# Patient Record
Sex: Male | Born: 2005 | Race: White | Hispanic: Yes | Marital: Single | Smoking: Never smoker
Health system: Southern US, Community
[De-identification: ages and names within clinical notes are randomized; demographics above are authoritative.]

## PROBLEM LIST (undated history)

## (undated) DIAGNOSIS — D444 Neoplasm of uncertain behavior of craniopharyngeal duct: Secondary | ICD-10-CM

## (undated) DIAGNOSIS — E230 Hypopituitarism: Secondary | ICD-10-CM

## (undated) DIAGNOSIS — H539 Unspecified visual disturbance: Secondary | ICD-10-CM

## (undated) DIAGNOSIS — R51 Headache: Secondary | ICD-10-CM

## (undated) DIAGNOSIS — T8859XA Other complications of anesthesia, initial encounter: Secondary | ICD-10-CM

## (undated) DIAGNOSIS — E2740 Unspecified adrenocortical insufficiency: Secondary | ICD-10-CM

## (undated) DIAGNOSIS — F909 Attention-deficit hyperactivity disorder, unspecified type: Secondary | ICD-10-CM

## (undated) HISTORY — PX: BRAIN SURGERY: SHX531

## (undated) HISTORY — PX: GASTROSTOMY TUBE CHANGE: SHX312

## (undated) HISTORY — PX: VENTRICULOPERITONEAL SHUNT: SHX204

## (undated) HISTORY — PX: CIRCUMCISION: SUR203

---
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
## 2024-01-06 ENCOUNTER — Ambulatory Visit (INDEPENDENT_AMBULATORY_CARE_PROVIDER_SITE_OTHER): Payer: Self-pay | Admitting: Pediatrics

## 2024-01-06 ENCOUNTER — Other Ambulatory Visit (INDEPENDENT_AMBULATORY_CARE_PROVIDER_SITE_OTHER): Payer: Self-pay

## 2024-01-06 ENCOUNTER — Encounter (INDEPENDENT_AMBULATORY_CARE_PROVIDER_SITE_OTHER): Payer: Self-pay | Admitting: Pediatrics

## 2024-01-06 VITALS — BP 98/62 | HR 66 | Wt 167.0 lb

## 2024-01-06 DIAGNOSIS — G8929 Other chronic pain: Secondary | ICD-10-CM

## 2024-01-06 DIAGNOSIS — R569 Unspecified convulsions: Secondary | ICD-10-CM

## 2024-01-06 DIAGNOSIS — E871 Hypo-osmolality and hyponatremia: Secondary | ICD-10-CM

## 2024-01-06 DIAGNOSIS — R269 Unspecified abnormalities of gait and mobility: Secondary | ICD-10-CM

## 2024-01-06 DIAGNOSIS — M25562 Pain in left knee: Secondary | ICD-10-CM

## 2024-01-06 DIAGNOSIS — D444 Neoplasm of uncertain behavior of craniopharyngeal duct: Secondary | ICD-10-CM | POA: Diagnosis not present

## 2024-01-06 DIAGNOSIS — F902 Attention-deficit hyperactivity disorder, combined type: Secondary | ICD-10-CM | POA: Diagnosis not present

## 2024-01-06 DIAGNOSIS — E232 Diabetes insipidus: Secondary | ICD-10-CM

## 2024-01-06 LAB — RENAL FUNCTION PANEL
Albumin: 4.3 g/dL (ref 3.6–5.1)
BUN: 18 mg/dL (ref 7–20)
CO2: 24 mmol/L (ref 20–32)
Calcium: 9.9 mg/dL (ref 8.9–10.4)
Chloride: 106 mmol/L (ref 98–110)
Creat: 0.91 mg/dL (ref 0.60–1.24)
Glucose, Bld: 104 mg/dL (ref 65–139)
Phosphorus: 5.1 mg/dL — ABNORMAL HIGH (ref 2.7–5.0)
Potassium: 4 mmol/L (ref 3.8–5.1)
Sodium: 144 mmol/L (ref 135–146)

## 2024-01-06 NOTE — Patient Instructions (Signed)
 Symptom management: Start Vimpat  Medication      Vimpat Dose 100 mg tablets    AM   PM   Week 1 1 tab (100 mg total) 1 tab (100 mg total)   Week 2 1.5 tab (150 mg total) 1.5 tab (150 mg total)   Week 3 and continue 2 tab (200 mg total) 2 tab (200mg  total)    Stop the medication and call me if Jaymien  develops side effects or has worsening seizures.  Please call if you have questions.   Stop Keppra  Medication      Keppra  Dose 500 mg tablets    AM   PM   Week 1 1 tab (500 mg total) 1 tab (500 mg total)   Week 2 0.5 tab (250 mg total) 0.5 tab (250mg  total)   Week 3 and continue off off    Stop the medication and call me if Earlin  develops side effects or has worsening seizures.  Please call if you have questions.   Referred to [facility] for behaviors when he gets upset We will not start Qelbree while starting Vimpat. We can consider in the future for impulsivity to help with sedation.  Care management: We will put in an order for chest PT for his nurses Referred for in-home PT. They can help identify the best type of knee brace Talk to his [facility] case manager about a day program or other options for social interaction Equipment needs: We will put in an order for a chest vest

## 2024-01-09 ENCOUNTER — Encounter (INDEPENDENT_AMBULATORY_CARE_PROVIDER_SITE_OTHER): Payer: Self-pay | Admitting: Pediatrics

## 2024-01-10 ENCOUNTER — Ambulatory Visit (INDEPENDENT_AMBULATORY_CARE_PROVIDER_SITE_OTHER): Payer: Self-pay | Admitting: Pediatrics

## 2024-01-10 MED ORDER — LACOSAMIDE 100 MG PO TABS
200.0000 mg | ORAL_TABLET | Freq: Two times a day (BID) | ORAL | 3 refills | Status: DC
Start: 2024-01-10 — End: 2024-04-10

## 2024-01-10 NOTE — Progress Notes (Signed)
Normal sodium.

## 2024-01-20 ENCOUNTER — Other Ambulatory Visit (INDEPENDENT_AMBULATORY_CARE_PROVIDER_SITE_OTHER): Payer: Self-pay | Admitting: Family

## 2024-01-20 ENCOUNTER — Other Ambulatory Visit (HOSPITAL_COMMUNITY): Payer: Self-pay

## 2024-01-20 MED ORDER — LEVETIRACETAM 500 MG PO TABS
500.0000 mg | ORAL_TABLET | Freq: Two times a day (BID) | ORAL | 0 refills | Status: DC
  Filled 2024-01-20: qty 30, 15d supply, fill #0

## 2024-01-24 ENCOUNTER — Encounter (INDEPENDENT_AMBULATORY_CARE_PROVIDER_SITE_OTHER): Payer: Self-pay

## 2024-01-24 ENCOUNTER — Encounter (INDEPENDENT_AMBULATORY_CARE_PROVIDER_SITE_OTHER): Payer: Self-pay | Admitting: Pediatrics

## 2024-01-27 ENCOUNTER — Ambulatory Visit (INDEPENDENT_AMBULATORY_CARE_PROVIDER_SITE_OTHER): Admitting: *Deleted

## 2024-01-27 DIAGNOSIS — F909 Attention-deficit hyperactivity disorder, unspecified type: Secondary | ICD-10-CM | POA: Diagnosis not present

## 2024-01-27 DIAGNOSIS — D444 Neoplasm of uncertain behavior of craniopharyngeal duct: Secondary | ICD-10-CM

## 2024-01-27 DIAGNOSIS — R269 Unspecified abnormalities of gait and mobility: Secondary | ICD-10-CM

## 2024-01-27 MED ORDER — ALBUTEROL SULFATE (2.5 MG/3ML) 0.083% IN NEBU: 2.5000 mg | INHALATION_SOLUTION | Freq: Four times a day (QID) | RESPIRATORY_TRACT | 0 refills | Status: AC | PRN
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
## 2024-02-11 ENCOUNTER — Ambulatory Visit (INDEPENDENT_AMBULATORY_CARE_PROVIDER_SITE_OTHER): Payer: Self-pay | Admitting: *Deleted

## 2024-02-11 DIAGNOSIS — F909 Attention-deficit hyperactivity disorder, unspecified type: Secondary | ICD-10-CM | POA: Diagnosis not present
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
## 2024-02-14 ENCOUNTER — Other Ambulatory Visit (INDEPENDENT_AMBULATORY_CARE_PROVIDER_SITE_OTHER): Payer: Self-pay | Admitting: Pediatrics

## 2024-02-14 DIAGNOSIS — E230 Hypopituitarism: Secondary | ICD-10-CM

## 2024-02-14 DIAGNOSIS — E2749 Other adrenocortical insufficiency: Secondary | ICD-10-CM

## 2024-02-19 ENCOUNTER — Other Ambulatory Visit (INDEPENDENT_AMBULATORY_CARE_PROVIDER_SITE_OTHER): Payer: Self-pay | Admitting: Family

## 2024-02-19 DIAGNOSIS — F909 Attention-deficit hyperactivity disorder, unspecified type: Secondary | ICD-10-CM

## 2024-02-25 ENCOUNTER — Other Ambulatory Visit (INDEPENDENT_AMBULATORY_CARE_PROVIDER_SITE_OTHER): Payer: Self-pay | Admitting: Family

## 2024-02-25 DIAGNOSIS — F909 Attention-deficit hyperactivity disorder, unspecified type: Secondary | ICD-10-CM

## 2024-03-02 ENCOUNTER — Ambulatory Visit (INDEPENDENT_AMBULATORY_CARE_PROVIDER_SITE_OTHER): Payer: Self-pay | Admitting: Pediatrics

## 2024-03-02 ENCOUNTER — Encounter (INDEPENDENT_AMBULATORY_CARE_PROVIDER_SITE_OTHER): Payer: Self-pay | Admitting: Pediatrics

## 2024-03-02 VITALS — BP 112/72 | HR 82 | Ht 65.55 in | Wt 166.2 lb

## 2024-03-02 DIAGNOSIS — E038 Other specified hypothyroidism: Secondary | ICD-10-CM

## 2024-03-02 DIAGNOSIS — E232 Diabetes insipidus: Secondary | ICD-10-CM

## 2024-03-02 DIAGNOSIS — E2749 Other adrenocortical insufficiency: Secondary | ICD-10-CM | POA: Diagnosis not present

## 2024-03-02 DIAGNOSIS — B351 Tinea unguium: Secondary | ICD-10-CM

## 2024-03-02 DIAGNOSIS — E230 Hypopituitarism: Secondary | ICD-10-CM

## 2024-03-02 DIAGNOSIS — Z79899 Other long term (current) drug therapy: Secondary | ICD-10-CM

## 2024-03-02 DIAGNOSIS — Z7187 Encounter for pediatric-to-adult transition counseling: Secondary | ICD-10-CM

## 2024-03-02 DIAGNOSIS — M8580 Other specified disorders of bone density and structure, unspecified site: Secondary | ICD-10-CM

## 2024-03-02 NOTE — Assessment & Plan Note (Signed)
-  Clinically euthyroid -Continue levothyroxine daily -Obtain TFTs with next blood draw, rec labs Q6 months

## 2024-03-02 NOTE — Assessment & Plan Note (Signed)
-  Moods are stable -current dose of Testosterone  enanthate 200mg /mL: 6 units = twice a week.

## 2024-03-02 NOTE — Progress Notes (Signed)
 Pediatric Endocrinology Consultation Follow-up Visit Ronald Brown Nov 20, 2005 [id number] Arlys Rogue, MD   HPI: Ronald Brown  is a 18 y.o. male presenting for follow-up of Panhypopituitarism.  he is accompanied to this visit by his father. Interpreter present throughout the visit: No.  [place] was last seen at [facility] on 09/03/2023.  Since last visit, Father has measured at home and he is at least 5'5.  Thyroid : levothyroxine  levo 88mcg daily with no missed doses. There has been no heat/cold intolerance, constipation/diarrhea, rapid heart rate, tremor, mood changes, poor energy, fatigue, nor hair loss. Chronic dry skin and constipation still treated with M.O.M. Growth: Norditropin  1.4mg  SQ at bedtime (0.13mg /kg/week) Adrenal Insufficiency: Body surface area is 1.87 meters squared.  -Hydrocortisone /Alkindi  sprinkles: 10mg  in AM, 5 mg in afternoon, and 5 mg in evening = 20mg /day  = 10.69 mg/m2/day  -Need for stress dosing: Yes for URI over May 2025 AVP Deficiency: Yes. dDAVP  1 tab in AM and 2 tabs in PM after 2 large voids. Father adjusts dose as needed. Yesterday didn't need to to later in the day.  Hypogonadism: Yes. Testosterone  enanthate 200mg /mL: 6 units = twice a week. Father adjusting doses based on aggression.  ROS: Greater than 10 systems reviewed with pertinent positives listed in HPI, otherwise neg. 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  Meds: Current Outpatient Medications  Medication Instructions   albuterol  (PROVENTIL ) 2.5 mg, Nebulization, Every 6 hours PRN   albuterol   (VENTOLIN  HFA) 108 (90 Base) MCG/ACT inhaler 1 puff, Every 6 hours PRN   desmopressin  (DDAVP ) 0.1 MG tablet TAKE 2 TABLETS (0.2 MG TOTAL) BY MOUTH TWICE A DAY   diazePAM , 15 MG Dose, (VALTOCO  15 MG DOSE) 2 x 7.5 MG/0.1ML LQPK Place 15 mg into the nose as needed (Inhale one spray ' 7.5 mg' in each nostril as needed for break through seizures).   folic acid  (FOLVITE ) 1 mg, Oral, Daily   guanFACINE  (INTUNIV ) 1 mg, Oral, Daily   hydrocortisone  (CORTEF ) 10 MG tablet TAKE 1 TAB BY MOUTH IN MORNING,TAKE 1/2 TAB BY MOUTH AT LUNCH&DINNER. Stress dose: triple or double the dose.   hydrocortisone  sodium succinate  (SOLU-CORTEF ) 100 MG injection Inject 100 mg into the muscle for 1 dose for acute adrenal insufficiency, then call EMS/go to the emergency room.   Insulin  Pen Needle (B-D UF III MINI PEN NEEDLES) 31G X 5 MM MISC Use as directed with growth hormone.   Insulin  Syringe-Needle U-100 31G X 15/64 0.3 ML MISC Use daily.   Lacosamide  200 mg, Oral, 2 times daily   levETIRAcetam  (KEPPRA ) 500 mg, Oral, 2 times daily   levothyroxine  (SYNTHROID ) 88 MCG tablet TAKE 1 TABLET BY MOUTH EVERY DAY BEFORE BREAKFAST   magnesium  aspartate (MAGINEX) 615 mg, Daily   magnesium  hydroxide (MILK OF MAGNESIA) 400 MG/5ML suspension 20 mLs, 2 times daily   melatonin 5 mg, Daily at bedtime   Norditropin  FlexPro 1.6 mg, Subcutaneous, Daily at bedtime   potassium chloride  (KLOR-CON ) 10 MEQ tablet TAKE 1 TABLET ( ) BY MOUTH DAILY**ON WEDNESDAY AND SUNDAY TAKE TWICE A DAY**   QUEtiapine  (SEROQUEL ) 50 MG tablet TAKE 1 TABLET BY MOUTH EVERYDAY AT BEDTIME   sodium chloride , PF, 0.9 % injection For use in adrenal crisis to be mixed  with hydrocortisone  as directed.   Syringe/Needle, Disp, (SYRINGE 3CC/25GX1) 25G X 1 3 ML MISC Use with hydrocortisone  for adrenal crisis.   testosterone  enanthate (DELATESTRYL ) 200 MG/ML injection Inject 6 mg into the skin once a week. 6 mg = 0.03 mL = 3 units on insulin  syringe   vitamin B-12  (CYANOCOBALAMIN ) 100 mcg, Oral, Daily   Vitamin D  5,000 Units, BH-each morning    Allergies: No Known Allergies  Surgical History: Past Surgical History:  Procedure Laterality Date   BRAIN SURGERY     June 2014   CIRCUMCISION     GASTROSTOMY TUBE CHANGE     removed   RADIOLOGY WITH ANESTHESIA N/A 08/13/2020   Procedure: MRI WITH ANESTHESIA OF BRAIN WITH AND WITHOUT CONTRAST;  Surgeon: Radiologist, Medication, MD;  Location: [facility] OR;  Service: Radiology;  Laterality: N/A;   RADIOLOGY WITH ANESTHESIA N/A 07/21/2022   Procedure: MRI BRAIN WITH AND WITHOUT CONTRAST WITH ANESTHESIA;  Surgeon: Radiologist, Medication, MD;  Location: [facility] OR;  Service: Radiology;  Laterality: N/A;   VENTRICULOPERITONEAL SHUNT      Family History: family history is not on file. He was adopted.  Social History: Social History   Social History Narrative   Pt lives at home with dad and  sister in college 1  His mother is deceased from malignant melanoma on 09/20/15. One dog in the house, no smoking.       grduated     reports that he has never smoked. He has never used smokeless tobacco. He reports that he does not drink alcohol and does not use drugs.  Physical Exam:  Vitals:   03/02/24 0945  BP: 112/72  Pulse: 82  Weight: 166 lb 3.2 oz (75.4 kg)  Height: 5' 5.55 (1.665 m)   BP 112/72 (BP Location: Right Arm, Patient Position: Sitting, Cuff Size: Normal)   Pulse 82   Ht 5' 5.55 (1.665 m)   Wt 166 lb 3.2 oz (75.4 kg)   BMI 27.19 kg/m  Body mass index: body mass index is 27.19 kg/m. Blood pressure %iles are not available for patients who are 18 years or older. 88 %ile (Z= 1.16) based on CDC (Boys, 2-20 Years) BMI-for-age based on BMI available on 03/02/2024.  Wt Readings from Last 3 Encounters:  03/02/24 166 lb 3.2 oz (75.4 kg) (68%, Z= 0.47)*  01/06/24 167 lb (75.8 kg) (70%, Z= 0.52)*  10/14/23 165 lb (74.8 kg) (68%, Z= 0.48)*   * Growth percentiles are based on CDC (Boys, 2-20 Years) data.   Ht  Readings from Last 3 Encounters:  03/02/24 5' 5.55 (1.665 m) (8%, Z= -1.42)*  10/14/23 5' 5.47 (1.663 m) (8%, Z= -1.43)*  09/03/23 5' 6.14 (1.68 m) (12%, Z= -1.19)*   * Growth percentiles are based on CDC (Boys, 2-20 Years) data.   Physical Exam Constitutional:      Appearance: Normal appearance.  HENT:     Head: Atraumatic.     Nose: Nose normal.     Mouth/Throat:     Mouth: Mucous membranes are moist.  Eyes:     Extraocular Movements: Extraocular movements intact.     Comments: glasses  Neck:     Comments: No goiter Cardiovascular:     Heart sounds: Normal heart sounds.  Pulmonary:     Effort: Pulmonary effort is normal. No respiratory distress.     Breath sounds: Normal breath sounds.  Abdominal:     General: There is no distension.  Musculoskeletal:     Cervical  back: Normal range of motion and neck supple.     Comments: Walks with walker  Skin:    Findings: No rash.     Comments: Dry, thick toenails, curving down.  Neurological:     Mental Status: He is alert. Mental status is at baseline.  Psychiatric:        Mood and Affect: Mood normal.        Behavior: Behavior normal.      Labs: Results for orders placed or performed in visit on 01/06/24  Renal function panel   Collection Time: 01/06/24  3:56 PM  Result Value Ref Range   Glucose, Bld 104 65 - 139 mg/dL   BUN 18 7 - 20 mg/dL   Creat 9.08 9.39 - 8.75 mg/dL   BUN/Creatinine Ratio SEE NOTE: 6 - 22 (calc)   Sodium 144 135 - 146 mmol/L   Potassium 4.0 3.8 - 5.1 mmol/L   Chloride 106 98 - 110 mmol/L   CO2 24 20 - 32 mmol/L   Calcium  9.9 8.9 - 10.4 mg/dL   Phosphorus 5.1 (H) 2.7 - 5.0 mg/dL   Albumin 4.3 3.6 - 5.1 g/dL    Imaging: Results for orders placed during the hospital encounter of 07/19/23  MR BRAIN W WO CONTRAST  Narrative CLINICAL DATA:  Seizure, new onset, no history of trauma. History of craniopharyngioma.  EXAM: MRI HEAD WITHOUT AND WITH CONTRAST  TECHNIQUE: Multiplanar,  multiecho pulse sequences of the brain and surrounding structures were obtained without and with intravenous contrast.  CONTRAST:  7mL GADAVIST  GADOBUTROL  1 MMOL/ML IV SOLN  COMPARISON:  CT head without contrast 07/18/2024. MR head without and with contrast 07/21/2022.  FINDINGS: Brain: No acute infarct, hemorrhage, or mass lesion is present. Chronic diffuse pachymeningeal thickening is again seen. Diffuse dural enhancement remains, likely secondary to the ventriculostomy. No focal mass lesion is present. The ventricles are slit-like. White matter changes are remote lacunar infarct is present in the right paramedian pons. Brainstem and cerebellum are normal.  Vascular: Flow is present in the major intracranial arteries.  Skull and upper cervical spine: Craniocervical junction is normal. Diffuse calvarial thickening is stable. No focal lesions are present. The upper cervical spine is within normal limits.  Sinuses/Orbits: The paranasal sinuses and mastoid air cells are clear. The globes and orbits are within normal limits.  IMPRESSION: 1. No acute intracranial abnormality or significant interval change. 2. Chronic diffuse pachymeningeal thickening and dural enhancement in addition to slit like ventricles are stable. This may represent over shunting. 3. 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 4. Diffuse calvarial thickening is stable. No focal lesions are present.   Electronically Signed By: Lonni Necessary M.D. On: 07/19/2023 19:45   Assessment/Plan: Ronald Brown was seen today for follow-up.  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. He also has developmental delay and is closer to an 19 year old with emotional outbursts and hoarding behaviors.  Jenson Beedle established care with this practice 06/24/2017 with Dr. Hershal and transitioned care to me 06/04/2022. 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  -Glucocorticoid Replacement: Body surface area is 1.87 meters squared. Last BSA 1.74    Maintenance: (8-10 mg/m2/day for primary AI, and 10-12 mg/m2/day for secondary AI)        -PO:  Hydrocortisone   10mg  in AM, 5 mg in afternoon, and 5 mg in evening = 20mg /day (10.69 mg/m2/day)           Stress dose: (36-50 mg/m2/day)      -PO: Hydrocortisone  30mg  Q8 (47.6 mg/m2/day)      -IV: Hydrocortisone  20mg  Q6      Emergency dose:      -Solu-Cortef  Act-O-Vial 100 mg once IM --> has hydrocortisone  vial with NaCl   -Mineralocorticoid Replacement: none   -Emergency Instructions: provided 03/02/2024  Orders: -     Testosterone , Total, LC/MS/MS -     T4, free -     Insulin -like growth factor -     Renal function panel -     Ambulatory referral to Internal Medicine -     Ambulatory referral to Endocrinology -     Ambulatory referral to Podiatry  Primary central diabetes insipidus (HCC) Overview: -Sodium goal 135-150 mg/dL  Assessment & Plan: -Continue monthly Na check, standing orders in place -Continue dDAVP  1 tab in AM and 2 tabs in PM after 2 large voids. 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 He was off of T from 2023 and restarted 08/26/2022 at a low dose. His father is doing a good job of titrating dose.   Assessment & Plan: -Moods are stable -current dose of Testosterone  enanthate 200mg /mL: 6 units = twice a week.    Orders: -     Testosterone , Total, LC/MS/MS -     T4, free -     Insulin -like growth factor -     Renal function panel -     Ambulatory referral to Internal Medicine -     Ambulatory referral to Endocrinology -     Ambulatory referral to Podiatry  Secondary hypothyroidism Overview: Central hypothyroidism  Assessment & Plan: -Clinically euthyroid -Continue levothyroxine  88mcg daily -Obtain TFTs with next blood draw, rec labs Q6 months  Orders: -     Testosterone , Total, LC/MS/MS -     T4, free -     Insulin -like growth factor -     Renal function panel -     Ambulatory referral to Internal Medicine -     Ambulatory referral to Endocrinology -     Ambulatory referral to Podiatry  Growth hormone deficiency (HCC) Assessment & Plan: -Last IGF-1 level normal -No side effects -Continue Nutropin  1.4mg  SQ Q6-7 days (0.13mg /kg/day)  Orders: -     Testosterone , Total, LC/MS/MS -     T4, free -     Insulin -like growth factor -  Renal function panel -     Ambulatory referral to Internal Medicine -     Ambulatory referral to Endocrinology -     Ambulatory referral to Podiatry  Long term current use of growth hormone Overview: Growth Hormone Therapy Abstract   Preferred Growth Hormone Agent: Nutropin /Norditropin  -Dose: 1.6 mg daily (0.165 mg/kg/week)   Initiation   Age at diagnosis:  18 years old   Diagnosis: Panhypopituitarism   Diagnostic tests  used for diagnosis and results:             IGF1 (ng/mL): Conitnuing 1.6mg  SQ QHS.   Recent Labs       Lab Results  Component Value Date    LABIGFI 421 08/07/2022             IGFBP3 (mg/L):    Recent Labs       Lab Results  Component Value Date    LABIGF 6.7 12/25/2019             Stim Testing: none                   Bone age: delayed by 3 years Epiphysis is OPEN Date: 06/16/2022        MRI:  IMPRESSION: 1. Overall, no significant interval change since the prior brain MRI from 08/13/2020. No new or acute intracranial pathology. 2. No evidence of recurrent craniopharyngioma. 3. Stable position of the right parietal ventricular catheter with unchanged slit-like ventricles. 4. Unchanged diffuse pachymeningeal thickening and enhancement with a probable 7 mm thick left cerebral convexity subdural collection, and mild rightward midline shift.               Date: 07/21/22   Therapy including date or age initiated/stopped:  2018 to current      Last thyroid  studies (TSH (mIU/L), T4 (ng/dL)): Recent Labs       Lab Results  Component Value Date    TSH <0.01 (L) 02/05/2022    FREET4 1.1 08/07/2022        Complications:  none   Additional therapies used: genotropin /norditropin    Continuation   Last Bone Age:              Epiphysis is OPEN             Date: 06/2022   Last IGF-1 (ng/mL):  Recent Labs       Lab Results  Component Value Date    LABIGFI 421 08/07/2022        Last IGFBP-3 (mg/L):  Recent Labs       Lab Results  Component Value Date    LABIGF 6.7 12/25/2019        Last thyroid  studies (TSH (mIU/L), T4 (ng/dL)): Recent Labs       Lab Results  Component Value Date    TSH <0.01 (L) 02/05/2022    FREET4 1.1 08/07/2022        Complications:  none   Additional therapies used: none   Last heights:  Ht Readings from Last 3 Encounters:  09/24/22 5' 3.51 (1.613 m) (2 %, Z= -2.03)*  09/24/22 5' 3 (1.6 m) (1 %, Z= -2.20)*  08/06/22  5' 2.8 (1.595 m) (1 %, Z= -2.25)*   * Growth percentiles are based on CDC (Boys, 2-20 Years) data.    Last weight:  Wt Readings from Last 3 Encounters:  12/07/22 153 lb (69.4 kg) (57 %, Z= 0.17)*  09/24/22 151 lb (68.5 kg) (55 %, Z= 0.13)*  09/24/22 151  lb (68.5 kg) (55 %, Z= 0.13)*   * Growth percentiles are based on CDC (Boys, 2-20 Years) data.     Orders: -     Ambulatory referral to Internal Medicine -     Ambulatory referral to Endocrinology -     Ambulatory referral to Podiatry  Delayed bone age Overview: Bone age: 60/06/2022 - My independent visualization of the left hand x-ray showed a bone age of phalanges 14 6/12 years and carpals 15 years with a chronological age of 16 years and 7 months.  Potential adult height of 164.8 cm, assuming bone age 9 9/12 years.    Orders: -     Ambulatory referral to Internal Medicine -     Ambulatory referral to Endocrinology -     Ambulatory referral to Podiatry  Counseling for transition from pediatric to adult care provider -     Ambulatory referral to Internal Medicine -     Ambulatory referral to Endocrinology  Onychomycosis of foot with other complication -     Ambulatory referral to Podiatry    Patient Instructions  Adult Internal Medicine: Address: [address], [place], [place] [address] Phone: [phone] Adult [facility]: Address: [address], [place], [place] [address] Phone: [phone]  Laboratory studies: Please obtain fasting (no eating, but can drink water) labs when you can and before the next visit.  Labs have been ordered to: [facility] is in our office Monday, Tuesday, Wednesday and Friday from 8AM-4PM, closed for lunch around 12:15pm-1:15pm. On Thursday, you can go to the third floor, Pediatric Neurology office at [address], [place], [place] [address]. You do not need an appointment, as they see patients in the order they arrive.  Let the front staff know that you are here for  labs, and they will help you get to the [facility] lab. You can also go to any [facility] lab in your area as the request was sent electronically. A popular location: [address] [place], [place] [address] Phone [phone].SABRA Remember that if you are taking levothyroxine , to get the labs done BEFORE the dose of levothyroxine , or 6 hours AFTER the dose of levothyroxine .  Medications: If you need refills in between visits, please ask your pharmacy to send us  a refill request.  Thyroid  Medication: no change   Growth  Medication: no change   Puberty/Sex Hormones Medication: no change   Adrenal Glands Adrenal Insufficiency Action Plan (Including Sick Day and Emergency Management) 06/04/2022   [name] Rogue Dolores 09-13-2005 18 y.o. 7 m.o.  Body surface area is 1.69 meters squared.  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    SITUATION  INSTRUCTIONS  Maintenance (Usual) Doses - Taken daily when well GO Medication: Hydrocortisone   10 mg (1 tab) at  AM 5 mg (1/2 tab) at afternoon 5 mg (1/2 tab) at PM   SICK DAY MANAGEMENT  Stress Dosing With any physical stress such as infections or injuries, the body needs higher amounts of hydrocortisone . In the event of fever (above 38 C or 100.4 Fahrenheit), infection that requires antibiotics, vomiting, diarrhea, or fracture, use the higher doses for 24 to 48 hours. Resume usual (maintenance) doses of hydrocortisone  when fever/stress has resolved. CAUTION Medication: Hydrocortisone   Take 30mg  (  3 tab) every 8 hours.  Stress dose is typically double or triple usual daily dose  Okay they are going to  Review sick day plan and/or Call endocrinology team at [phone].  EMERGENCY MANAGEMENT When unable to tolerate oral medication, hydrocortisone  by injection will be necessary In  the event of severe illness, trauma, inability to tolerate oral hydrocortisone , unconscious, or repeated vomiting, administer Sol u-Cortef  by intramuscular (IM) injection  CHILD will need urgent medical evaluation and IV fluids STOP Solu-Cortef  (100mg  in 2mL)  Inject 100mg  (2 mL) in muscle  Go to the emergency department or call 911  Call endocrinology team   PREPARATION FOR SURGERY  The stress dose of surgery and to recover from it necessitates higher doses of hydrocortisone  during and 1 to 3 days after surgery. This requires a team approach among the healthcare professionals managing the surgery and postoperative care.  She SURGERY Make the surgeon (or dentist) and anesthesiologist aware Diagnosis of adrenal insufficiency and medication doses  Surgical Team and endocrinologist should communicate with each other Plan well before the date of surgery Decide on hydrocortisone  doses before and after surgery           Follow-up:   Return in about 6 months (around 09/02/2024) for to assess growth and development, to review studies, follow up.  Medical decision-making:  I have personally spent 32 minutes involved in face-to-face and non-face-to-face activities for this patient on the day of the visit. Professional time spent includes the following activities, in addition to those noted in the documentation: preparation time/chart review, ordering of medications/tests/procedures, obtaining and/or reviewing separately obtained history, counseling and educating the patient/family/caregiver, performing a medically appropriate examination and/or evaluation, referring and communicating with other health care professionals for care coordination, and documentation in the EHR.  Thank you for the opportunity to participate in the care of your patient. Please do not hesitate to contact me should you have any questions regarding the assessment or treatment plan.   Sincerely,   Marce Rucks, MD

## 2024-03-02 NOTE — Assessment & Plan Note (Addendum)
-  Last IGF-1 level normal -No side effects -Continue Nutropin  1.4mg  SQ Q6-7 days (0.13mg /kg/day)

## 2024-03-02 NOTE — Assessment & Plan Note (Addendum)
 With secondary adrenal insufficiency.  -Glucocorticoid Replacement: Body surface area is 1.87 meters squared. Last BSA 1.74    Maintenance: (8-10 mg/m2/day for primary AI, and 10-12 mg/m2/day for secondary AI)        -PO:  Hydrocortisone   10mg  in AM, 5 mg in afternoon, and 5 mg in evening = 20mg /day (10.69 mg/m2/day)           Stress dose: (36-50 mg/m2/day)      -PO: Hydrocortisone  30mg  Q8 (47.6 mg/m2/day)      -IV: Hydrocortisone  20mg  Q6      Emergency dose:      -Solu-Cortef  Act-O-Vial 100 mg once IM --> has hydrocortisone  vial with NaCl   -Mineralocorticoid Replacement: none   -Emergency Instructions: provided 03/02/2024

## 2024-03-02 NOTE — Patient Instructions (Addendum)
 Adult Internal Medicine: Address: [address], [place], [place] [address] Phone: [phone] Adult [facility]: Address: [address], [place], [place] [address] Phone: [phone]  Laboratory studies: Please obtain fasting (no eating, but can drink water) labs when you can and before the next visit.  Labs have been ordered to: [facility] is in our office Monday, Tuesday, Wednesday and Friday from 8AM-4PM, closed for lunch around 12:15pm-1:15pm. On Thursday, you can go to the third floor, Pediatric Neurology office at [address] [place], [place] [address]. You do not need an appointment, as they see patients in the order they arrive.  Let the front staff know that you are here for labs, and they will help you get to the [facility]. You can also go to any [facility] lab in your area as the request was sent electronically. A popular location: [address] [place], [place] [address] Phone [phone].SABRA Remember that if you are taking levothyroxine , to get the labs done BEFORE the dose of levothyroxine , or 6 hours AFTER the dose of levothyroxine .  Medications: If you need refills in between visits, please ask your pharmacy to send us  a refill request.  Thyroid  Medication: no change   Growth  Medication: no change   Puberty/Sex Hormones Medication: no change   Adrenal Glands Adrenal Insufficiency Action Plan (Including Sick Day and Emergency Management) 06/04/2022   Ronald Brown January 19, 2006 18 y.o. 7 m.o.  Body surface area is 1.69 meters squared.  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    SITUATION  INSTRUCTIONS  Maintenance (Usual) Doses - Taken daily when well GO Medication: Hydrocortisone   10 mg (1 tab) at  AM 5 mg (1/2 tab) at afternoon 5 mg (1/2 tab) at PM   SICK DAY MANAGEMENT   Stress Dosing With any physical stress such as infections or injuries, the body needs higher amounts of hydrocortisone . In the event of fever (above 38 C or 100.4 Fahrenheit), infection that requires antibiotics, vomiting, diarrhea, or fracture, use the higher doses for 24 to 48 hours. Resume usual (maintenance) doses of hydrocortisone  when fever/stress has resolved. CAUTION Medication: Hydrocortisone   Take 30mg  (3 tab) every 8 hours.  Stress dose is typically double or triple usual daily dose  Okay they are going to  Review sick day plan and/or Call endocrinology team at [phone].  EMERGENCY MANAGEMENT When unable to tolerate oral medication, hydrocortisone  by injection will be necessary In the event of severe illness, trauma, inability to tolerate oral hydrocortisone , unconscious, or repeated vomiting, administer Sol u-Cortef  by intramuscular (IM) injection  CHILD will need urgent medical evaluation and IV fluids STOP Solu-Cortef  (100mg  in 2mL)  Inject 100mg  (2 mL) in muscle  Go to the emergency department or call 911  Call endocrinology team   PREPARATION FOR SURGERY  The stress dose of surgery and to recover from it necessitates higher doses of hydrocortisone  during and 1 to 3 days after surgery. This requires a team approach among the healthcare professionals managing the surgery and postoperative care.  She SURGERY Make the surgeon (or dentist) and anesthesiologist aware Diagnosis of adrenal insufficiency and medication doses  Surgical Team and endocrinologist should communicate with each other Plan well before the date of surgery Decide on hydrocortisone  doses  before and after surgery

## 2024-03-02 NOTE — Assessment & Plan Note (Signed)
-  Continue monthly Na check, standing orders in place -Continue dDAVP 1 tab in AM and 2 tabs in PM after 2 large voids. Father adjusts dose as needed

## 2024-03-07 ENCOUNTER — Ambulatory Visit (INDEPENDENT_AMBULATORY_CARE_PROVIDER_SITE_OTHER): Admitting: Podiatry

## 2024-03-07 ENCOUNTER — Encounter: Payer: Self-pay | Admitting: Podiatry

## 2024-03-07 ENCOUNTER — Encounter (INDEPENDENT_AMBULATORY_CARE_PROVIDER_SITE_OTHER): Payer: Self-pay

## 2024-03-07 DIAGNOSIS — M79671 Pain in right foot: Secondary | ICD-10-CM

## 2024-03-07 DIAGNOSIS — B351 Tinea unguium: Secondary | ICD-10-CM

## 2024-03-07 DIAGNOSIS — L600 Ingrowing nail: Secondary | ICD-10-CM | POA: Diagnosis not present

## 2024-03-07 DIAGNOSIS — M79672 Pain in left foot: Secondary | ICD-10-CM

## 2024-03-07 NOTE — Progress Notes (Signed)
 Patient presents for evaluation and treatment of tenderness and some redness around nails feet.  Tenderness around toes with walking and wearing shoes.  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  Physical exam:  General appearance: Alert, pleasant, and in no acute distress.  Vascular: Pedal pulses: DP 2/4 B/L, PT 2 to/4 B/L.  Mild edema Lower legs bilaterally-capillary refill time immediate  Neuologic: Muscle weakness lower extremity bilaterally.  No Achilles tendon reflex bilaterally  Dermatologic:  Nails thickened, disfigured, discolored 1-5 BL with subungual debris.  Redness and hypertrophic nail folds along nail folds bilaterally but no signs of drainage or infection.  Hallux nails are strongly incurvated with thinning of the skin at the nail edge and hypertrophy of the nail fold with some redness present.  No drainage is present.  Musculoskeletal:  Severe pes planovalgus planus with lower extremity muscle weakness bilaterally   Diagnosis: 1. Painful onychomycotic nails 1 through 5 bilaterally. 2. Pain toes 1 through 5 bilaterally. 2.  Ingrown nails hallux bilaterally  Plan: -New patient office visit for evaluation and management level 3.  Modifier 25. -Discussed the ingrown nails on the hallux bilaterally we will try to keep the nails trimmed down.  If they worsen or start to get infected we may need to consider matrixectomy's of the borders. -  discussed with him and his caregivers the fungus nails.  Given the number of  medicines he is on already would prefer to avoid oral Lamisil.  I do not think Penlac would be beneficial for him. -Debrided onychomycotic nails 1 through 5 bilaterally.  Sharply debrided nails with nail clipper and reduced with a power bur.  Return 3 months RFC

## 2024-03-15 ENCOUNTER — Ambulatory Visit (INDEPENDENT_AMBULATORY_CARE_PROVIDER_SITE_OTHER): Admitting: *Deleted

## 2024-03-18 ENCOUNTER — Other Ambulatory Visit (INDEPENDENT_AMBULATORY_CARE_PROVIDER_SITE_OTHER): Payer: Self-pay | Admitting: Pediatrics

## 2024-03-18 DIAGNOSIS — E038 Other specified hypothyroidism: Secondary | ICD-10-CM

## 2024-03-18 DIAGNOSIS — E230 Hypopituitarism: Secondary | ICD-10-CM

## 2024-03-27 LAB — T4, FREE: Free T4: 1 ng/dL (ref 0.8–1.4)

## 2024-03-27 LAB — RENAL FUNCTION PANEL
Albumin: 4.4 g/dL (ref 3.6–5.1)
BUN: 18 mg/dL (ref 7–20)
CO2: 29 mmol/L (ref 20–32)
Calcium: 9.9 mg/dL (ref 8.9–10.4)
Chloride: 114 mmol/L — ABNORMAL HIGH (ref 98–110)
Creat: 0.88 mg/dL (ref 0.60–1.24)
Glucose, Bld: 103 mg/dL (ref 65–139)
Phosphorus: 4.7 mg/dL (ref 2.7–5.0)
Potassium: 3.4 mmol/L — ABNORMAL LOW (ref 3.8–5.1)
Sodium: 152 mmol/L — ABNORMAL HIGH (ref 135–146)

## 2024-03-27 LAB — INSULIN-LIKE GROWTH FACTOR
IGF-I, LC/MS: 540 ng/mL (ref 108–548)
Z-Score (Male): 1.8 {STDV} (ref ?–2.0)

## 2024-03-28 ENCOUNTER — Ambulatory Visit (INDEPENDENT_AMBULATORY_CARE_PROVIDER_SITE_OTHER): Payer: Self-pay | Admitting: Pediatrics

## 2024-03-28 NOTE — Progress Notes (Signed)
 Normal IGF-1 for being on growth hormone, normal thyroid  hormone level and sodium is on higher end with lower potassium. Please work on hydration.

## 2024-04-03 ENCOUNTER — Other Ambulatory Visit (INDEPENDENT_AMBULATORY_CARE_PROVIDER_SITE_OTHER): Payer: Self-pay | Admitting: Pediatrics

## 2024-04-05 NOTE — Progress Notes (Signed)
 Patient: Ronald Brown MRN: [id number] Sex: male DOB: 2005/07/30  Provider: Corean Geralds, MD Location of Care: [facility]- Pediatric [facility] Care Note type: Routine return visit  History of Present Illness: Referral source: Arlys Rogue, MD History from: Epic chart, patient's father and his private duty nurse Chief complaint: complex care  Ronald Brown is a 18 y.o. 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 He also has developmental and intellectual delays and hypoxemia in sleep requiring supplemental oxygen who I am seeing in follow-up for complex care management. Patient was last seen on 01/06/2024 where I weaned Keppra , started Vimpat , continued Intuniv , planned to consider Qelbree in the future, referred to [facility], recommended chest PT done by his nurses, referred for in-home PT, and recommended discussing a day program with [facility].  Since that appointment, patient has not been to the hospital or ED.   Patient presents today with father who reports the following:   Symptom management:  Titration of Keppra  and Vimpat  went well. He is off of Keppra , taking 1 tablet Vimpat  BID. He has not had any seizures. Dad has not noticed improvements in behavior since stopping Keppra .    His behavior does well with the nurse, just having trouble with dad. He previously had more behaviors with the nurse, but does better since starting Intuniv . He has sedation on increased doses of Intuniv . Seroquel  is primarily for sleep, doesn't last as much during the day. He has the most behavioral issues when he is told no. His behavior comes on quickly so it is hard to implement strategies to help. They are able to work on some strategies, planning to implement rewards and consequences. Dad is ready for a change, there are safety  concerns. He does give 1/4 tablet of Seroquel  as needed for a hard day. He also gives it preventively for high risk situations.   They have been using his chest vest, they have used it for congestion. When he gets sick, it can take a few months to get over. He asks for the chest vest when he feels congested.   Dad skips growth hormone a lot of days because his levels were on the high end even with skipped doses.   Care coordination (other providers): Patient established with Rojelio Coup with [facility] on 01/27/2024 and has continued to follow with her where they discussed grounding exercises for when he becomes upset.   Patient saw Dr. Margarete with endocrinology on 03/02/2024 where she continued his medications and referred to Internal Medicine, endocrinology, and podiatry. Dad has not heard from internal medicine or endocrinology.   Patient saw Dr. Christine with podiatry on 03/07/2024 for ingrown toe nails.   Case management needs:  Dad did not hear about physical therapy.  Dad has heard from [facility] about Risk analyst. Ronald Brown is on the list.   Equipment needs:  At the last appointment, ordered a chest vest. He has gotten his new vest.   They have not been to [facility] for his leg brace, planning to return. His self-bought one is working well.   Needs new oxygen tanks.  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   Vision abnormalities     Surgical History Past Surgical History:  Procedure Laterality Date   BRAIN SURGERY     June 2014   CIRCUMCISION     GASTROSTOMY TUBE CHANGE     removed   RADIOLOGY WITH ANESTHESIA N/A 08/13/2020   Procedure:  MRI WITH ANESTHESIA OF BRAIN WITH AND WITHOUT CONTRAST;  Surgeon: Radiologist, Medication, MD;  Location: [facility] OR;  Service: Radiology;  Laterality: N/A;   RADIOLOGY WITH ANESTHESIA N/A 07/21/2022   Procedure: MRI BRAIN WITH AND WITHOUT CONTRAST WITH ANESTHESIA;  Surgeon: Radiologist, Medication, MD;  Location: [facility] OR;  Service: Radiology;  Laterality: N/A;   VENTRICULOPERITONEAL SHUNT      Family History family history is not on file. He was adopted.   Social History Social History   Social History Narrative   Pt lives at home with dad and  sister in college 1  His mother is deceased from malignant melanoma on 09/20/15. One dog in the house, no smoking.       grduated    Allergies No Known Allergies  Medications Current Outpatient Medications on File Prior to Visit  Medication Sig Dispense Refill   albuterol  (PROVENTIL ) (2.5 MG/3ML) 0.083% nebulizer solution Take 3 mLs (2.5 mg total) by nebulization every 6 (six) hours as needed for wheezing (cough). 75 mL 0   albuterol  (VENTOLIN  HFA) 108 (90 Base) MCG/ACT inhaler Inhale 1 puff into the lungs every 6 (six) hours as needed for wheezing or shortness of breath.     Cholecalciferol  (VITAMIN D ) 125 MCG (5000 UT) CAPS Take 5,000 Units by mouth every morning.     desmopressin  (DDAVP ) 0.1 MG tablet TAKE 2 TABLETS (0.2 MG TOTAL) BY MOUTH TWICE A DAY 360 tablet 1   diazePAM , 15 MG Dose, (VALTOCO  15 MG DOSE) 2 x 7.5 MG/0.1ML LQPK Place 15 mg into the nose as needed (Inhale one spray ' 7.5 mg' in each nostril as needed for break through seizures). 2 each 0   folic acid  (FOLVITE ) 1 MG tablet TAKE 1 TABLET BY MOUTH EVERY DAY 90 tablet 1   guanFACINE  (INTUNIV ) 1 MG TB24 ER tablet TAKE 1 TABLET BY MOUTH EVERY DAY 90 tablet 1   hydrocortisone  (CORTEF ) 10 MG tablet TAKE 1 TAB BY MOUTH IN MORNING,TAKE 1/2 TAB BY MOUTH AT LUNCH&DINNER. Stress dose: triple or double the dose. 270 tablet 1   hydrocortisone  sodium succinate  (SOLU-CORTEF ) 100 MG injection  Inject 100 mg into the muscle for 1 dose for acute adrenal insufficiency, then call EMS/go to the emergency room. 2 mL 5   Insulin  Pen Needle (B-D UF III MINI PEN NEEDLES) 31G X 5 MM MISC Use as directed with growth hormone. 100 each 3   Insulin  Syringe-Needle U-100 31G X 15/64 0.3 ML MISC Use daily. 30 each 6   levothyroxine  (SYNTHROID ) 88 MCG tablet TAKE 1 TABLET BY MOUTH EVERY DAY BEFORE BREAKFAST 90 tablet 1   magnesium  aspartate (MAGINEX) 615 MG tablet Take 615 mg by mouth daily.     magnesium  hydroxide (MILK OF MAGNESIA) 400 MG/5ML suspension Take 20 mLs by mouth 2 (two) times daily.     melatonin 5 MG TABS Take 5 mg by mouth at bedtime.     potassium chloride  (KLOR-CON ) 10 MEQ tablet TAKE 1 TABLET ( ) BY MOUTH DAILY**ON WEDNESDAY AND SUNDAY TAKE TWICE A DAY** 108 tablet 5   sodium chloride , PF, 0.9 % injection  For use in adrenal crisis to be mixed with hydrocortisone  as directed. 20 mL 5   Somatropin  (NORDITROPIN  FLEXPRO) 30 MG/3ML SOPN Inject 1.6 mg into the skin at bedtime. 6 mL 5   Syringe/Needle, Disp, (SYRINGE 3CC/25GX1) 25G X 1 3 ML MISC Use with hydrocortisone  for adrenal crisis. 100 each 1   testosterone  enanthate (DELATESTRYL ) 200 MG/ML injection Inject 6 mg into the skin once a week. 6 mg = 0.03 mL = 3 units on insulin  syringe 5 mL 5   vitamin B-12 (CYANOCOBALAMIN ) 100 MCG tablet Take 1 tablet (100 mcg total) by mouth daily. 100 tablet 3   No current facility-administered medications on file prior to visit.   The medication list was reviewed and reconciled. All changes or newly prescribed medications were explained.  A complete medication list was provided to the patient/caregiver.  Physical Exam BP 100/60 (BP Location: Left Arm, Patient Position: Sitting, Cuff Size: Normal)   Pulse 64   Wt 176 lb 6.4 oz (80 kg)   BMI 28.86 kg/m  Weight for age: 78 %ile (Z= 0.79) based on CDC (Boys, 2-20 Years) weight-for-age data using data from 04/10/2024.  Length for age: No height  on file for this encounter. BMI: Body mass index is 28.86 kg/m. No results found. Gen: well appearing happy neuroaffected  teen Skin: No rash, No neurocutaneous stigmata. HEENT: Normocephalic, no dysmorphic features, no conjunctival injection, nares patent, mucous membranes moist, oropharynx clear. Resp: normal work of breathing RC:jeezjmd well perfused Abd: non-distended.  Ext: No deformities, no muscle wasting, ROM full.   Neurological Examination: MS: Awake, alert, interactive. Engages well, dysarthric speech but speaks in complete sentences.  Responds appropriately to questions and follows commands.  Cranial Nerves: EOM normal, no nystagmus; no ptsosis, face symmetric with full strength of facial muscles, hearing grossly intact, open mouth, unable to see palat.  Motor- At least antigravity in all muscle groups. No abnormal movements Reflexes-deferred Sensation: deferred Coordination: No dysmetria with reaching for objects.  Gait: Able to walk with support, wide based gait but stable  Diagnosis:  1. Insomnia, unspecified type      Assessment and Plan Herberth Deharo is a 18 y.o. 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 He also has developmental and intellectual delays and hypoxemia in sleep requiring supplemental oxygen who presents for follow-up in the pediatric complex care clinic. Patient continues to have behavior issues that are causing safety risks. Started Qelbree to address behaviors and increased Seroquel  as needed. His seizures are well controlled on Vimpat  so refilled at current dose.   Symptom management:  Start Qelbree 200 mg Refilled Vimpat  100 mg BID as a 90 day supply I recommend 1/4-1/2 tablet as needed of Seroquel  in addition to 50 mg at night for Patty's behaviors Continue albuterol  2.5 mg by  nebulization PRN, folic acid  1 mg, Intuniv  1 mg, Klor-Con  10 MEQ, and Vitamin B12 100 mcg I recommend using his chest vest once per day at baseline and up to four times per day as tolerated for secretion management.  Care coordination: Plan to follow up about Internal Medicine and endocrinology referral  Case management needs:  Plan to follow up about PT referral I recommend talking to [facility] about a day program to help prevent boredom during the day  Equipment needs:  No new equipment needs.   Decision making/Advanced care planning: Not addressed at this visit, patient  remains at full code.   The CARE PLAN for reviewed and revised to represent the changes above.  This is available in Epic under snapshot, and a physical binder provided to the patient, that can be used for anyone providing care for the patient.    I spend 60 minutes on day of service on this patient including review of chart, discussion with patient and family, coordination with other providers and management of orders and paperwork. This time does not include does include any behavioral screenings, baclofen pump refills, or VNS interrogations.   Return in about 6 weeks (around 05/22/2024).  I, Earnie Brandy, scribed for and in the presence of Corean Geralds, MD at today's visit on 04/10/2024.  I, Corean Geralds MD MPH, personally performed the services described in this documentation, as scribed by Earnie Brandy in my presence on 04/10/2024 and it is accurate, complete, and reviewed by me.    Corean Geralds MD MPH Neurology,  Neurodevelopment and Neuropalliative care [facility] Child Neurology  [address] [place], [place], [place] [address] Phone: [phone]

## 2024-04-07 ENCOUNTER — Telehealth (INDEPENDENT_AMBULATORY_CARE_PROVIDER_SITE_OTHER): Payer: Self-pay | Admitting: Pediatrics

## 2024-04-07 NOTE — Telephone Encounter (Signed)
  Name of who is calling: Adriana G  Caller's Relationship to Patient: [employer]CH [employer] LANGUAGE   Best contact number: [phone]  Provider they see: waddell   Reason for call: Wanted to know if waddell can sign the forms for him to countie speech therapy     PRESCRIPTION REFILL ONLY  Name of prescription:  Pharmacy:

## 2024-04-07 NOTE — Telephone Encounter (Signed)
 Returned Adriana's call about his paperwork. He is transitioning primary care providers, so she was asking if Dr. Waddell could sign his speech therapy plan of care. Provided her the office fax number.

## 2024-04-10 ENCOUNTER — Encounter (INDEPENDENT_AMBULATORY_CARE_PROVIDER_SITE_OTHER): Payer: Self-pay | Admitting: Pediatrics

## 2024-04-10 ENCOUNTER — Ambulatory Visit (INDEPENDENT_AMBULATORY_CARE_PROVIDER_SITE_OTHER): Admitting: Pediatrics

## 2024-04-10 VITALS — BP 100/60 | HR 64 | Wt 176.4 lb

## 2024-04-10 DIAGNOSIS — R569 Unspecified convulsions: Secondary | ICD-10-CM

## 2024-04-10 DIAGNOSIS — F819 Developmental disorder of scholastic skills, unspecified: Secondary | ICD-10-CM

## 2024-04-10 DIAGNOSIS — G4700 Insomnia, unspecified: Secondary | ICD-10-CM

## 2024-04-10 DIAGNOSIS — D444 Neoplasm of uncertain behavior of craniopharyngeal duct: Secondary | ICD-10-CM

## 2024-04-10 DIAGNOSIS — R6250 Unspecified lack of expected normal physiological development in childhood: Secondary | ICD-10-CM

## 2024-04-10 DIAGNOSIS — G4736 Sleep related hypoventilation in conditions classified elsewhere: Secondary | ICD-10-CM

## 2024-04-10 DIAGNOSIS — R1311 Dysphagia, oral phase: Secondary | ICD-10-CM

## 2024-04-10 DIAGNOSIS — G8929 Other chronic pain: Secondary | ICD-10-CM

## 2024-04-10 DIAGNOSIS — Z9981 Dependence on supplemental oxygen: Secondary | ICD-10-CM

## 2024-04-10 DIAGNOSIS — F902 Attention-deficit hyperactivity disorder, combined type: Secondary | ICD-10-CM

## 2024-04-10 MED ORDER — LACOSAMIDE 100 MG PO TABS: 100.0000 mg | ORAL_TABLET | Freq: Two times a day (BID) | ORAL | 2 refills | Status: AC

## 2024-04-10 MED ORDER — VILOXAZINE HCL ER 200 MG PO CP24: 200.0000 mg | ORAL_CAPSULE | Freq: Every day | ORAL | 2 refills | Status: DC

## 2024-04-10 MED ORDER — QUETIAPINE FUMARATE 50 MG PO TABS: ORAL_TABLET | ORAL | 1 refills | Status: DC

## 2024-04-10 NOTE — Patient Instructions (Addendum)
 Symptom management: Start Qelbree 200 mg Refilled Vimpat  as a 90 day supply I recommend 1/4-1/2 tablet as needed of Seroquel  for Ronald Brown's behaviors I recommend using his chest vest once per day at baseline and up to four times per day as tolerated for secretion management.  Care Coordination: We will follow up about Internal Medicine and endocrinology referral Care management: We will follow up about PT referral I recommend talking to [facility] about a day program, ph [phone].

## 2024-04-12 ENCOUNTER — Encounter (INDEPENDENT_AMBULATORY_CARE_PROVIDER_SITE_OTHER): Payer: Self-pay

## 2024-04-19 ENCOUNTER — Institutional Professional Consult (permissible substitution) (INDEPENDENT_AMBULATORY_CARE_PROVIDER_SITE_OTHER): Admitting: *Deleted

## 2024-05-19 ENCOUNTER — Telehealth (INDEPENDENT_AMBULATORY_CARE_PROVIDER_SITE_OTHER): Payer: Self-pay | Admitting: Pediatrics

## 2024-05-19 NOTE — Telephone Encounter (Signed)
  Name of who is calling: lindsey w  Caller's Relationship to Patient: [facility]   Best contact number: [phone]  Provider they see: margarete   Reason for call: paperwork that needs to be filled out and signed, she would like a call back with an update.      PRESCRIPTION REFILL ONLY  Name of prescription:  Pharmacy:

## 2024-05-19 NOTE — Telephone Encounter (Signed)
 All paper work is scanned in pts chart starting from 11/11 and also printed out and put in toys ''r'' us

## 2024-05-23 ENCOUNTER — Telehealth (INDEPENDENT_AMBULATORY_CARE_PROVIDER_SITE_OTHER): Payer: Self-pay | Admitting: Pediatrics

## 2024-05-23 NOTE — Telephone Encounter (Signed)
 Morna called to let Dr. Waddell know that Gedalya's skilled nursing hours has been transferred to [facility] of Care effective 05/12/24.

## 2024-05-23 NOTE — Telephone Encounter (Signed)
 Noted

## 2024-05-24 ENCOUNTER — Ambulatory Visit (INDEPENDENT_AMBULATORY_CARE_PROVIDER_SITE_OTHER): Admitting: Family

## 2024-05-24 ENCOUNTER — Encounter (INDEPENDENT_AMBULATORY_CARE_PROVIDER_SITE_OTHER): Payer: Self-pay | Admitting: Family

## 2024-05-24 VITALS — BP 104/70 | HR 60 | Wt 172.0 lb

## 2024-05-24 DIAGNOSIS — G8929 Other chronic pain: Secondary | ICD-10-CM

## 2024-05-24 DIAGNOSIS — D444 Neoplasm of uncertain behavior of craniopharyngeal duct: Secondary | ICD-10-CM | POA: Diagnosis not present

## 2024-05-24 DIAGNOSIS — G4700 Insomnia, unspecified: Secondary | ICD-10-CM | POA: Diagnosis not present

## 2024-05-24 DIAGNOSIS — G4734 Idiopathic sleep related nonobstructive alveolar hypoventilation: Secondary | ICD-10-CM

## 2024-05-24 DIAGNOSIS — M25562 Pain in left knee: Secondary | ICD-10-CM | POA: Diagnosis not present

## 2024-05-24 DIAGNOSIS — F909 Attention-deficit hyperactivity disorder, unspecified type: Secondary | ICD-10-CM

## 2024-05-24 MED ORDER — QUETIAPINE FUMARATE 50 MG PO TABS: ORAL_TABLET | ORAL | 1 refills | Status: AC

## 2024-05-24 MED ORDER — VILOXAZINE HCL ER 200 MG PO CP24: 200.0000 mg | ORAL_CAPSULE | Freq: Every day | ORAL | 2 refills | Status: AC

## 2024-05-24 NOTE — Patient Instructions (Signed)
 It was a pleasure to see you today!  Instructions for you until your next appointment are as follows: MRI of the Brain order placed for January 2026 Increase Seroquel  to 1+1/2 tablets at bedtime to see if helps [name] to sleep longer at night Continue Quelbree for now Knee brace referral sent to [facility] Please sign up for MyChart if you have not done so. Please plan to return for follow up in February with Dr Waddell or sooner if needed.  Feel free to contact our office during normal business hours at [phone] with questions or concerns. If there is no answer or the call is outside business hours, please leave a message and our clinic staff will call you back within the next business day.  If you have an urgent concern, please stay on the line for our after-hours answering service and ask for the on-call neurologist.     I also encourage you to use MyChart to communicate with me more directly. If you have not yet signed up for MyChart within [facility], the front desk staff can help you. However, please note that this inbox is NOT monitored on nights or weekends, and response can take up to 2 business days.  Urgent matters should be discussed with the on-call pediatric neurologist.   At [facility], we are committed to providing exceptional care. You will receive a patient satisfaction survey through text or email regarding your visit today. Your opinion is important to me. Comments are appreciated.

## 2024-05-24 NOTE — Progress Notes (Signed)
 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 He also has developmental and intellectual delays and hypoxemia in sleep requiring supplemental oxygen.     Since last visit: Dad is unsure if the Ronald Brown has been effective. There are still problems with outbursts and impulsive behavior. He notes that Ronald Brown tends to test new nurses and that he is generally more aggressive with him.  When Ronald Brown's behavior is discussed, he becomes very remorseful and teary He notes that Ronald Brown often sleeps until about 12:00-1:00AM, then awakens and has difficulty returning to sleep Ronald Brown's knee brace is broken and needs to be replaced. Dad bought a basic brace online which has helped until he can get the custom brace replaced.  Adult endocrinology referral is pending.  He has remained seizure free since his last visit Iren has been otherwise generally healthy since he was last seen. No health concerns today other than previously mentioned.  Review of systems: Please see HPI for neurologic and other pertinent review of systems. Otherwise all other systems were reviewed and were negative.  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   Osteopenia determined by x-ray 09/25/2022   Other osteoporosis without current pathological fracture 08/06/2022   Delayed bone age 43/30/2023   Primary central diabetes insipidus 06/04/2022   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   Nocturnal hypoxia 08/15/2018   Ineffective airway clearance 07/24/2018   Recurrent productive cough 07/24/2018   Hypersomnia with sleep apnea 05/05/2018   Jaw protrusion 05/05/2018   At high risk for falls in pediatric patient 04/16/2018   Respiration abnormal    Rhinovirus 04/08/2018   Overweight 01/20/2018   Urinary retention 08/30/2017   Dysphagia 08/23/2017   Speech delay 08/23/2017   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[facility]) 07/07/2017   Gait disorder 07/07/2017   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   Dysarthria 07/07/2017   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   Secondary adrenal insufficiency 06/25/2017   Bradycardia 06/25/2017   Hypoxemia 04/27/2017   Hypernatremia 04/26/2017   Hypothermia 04/23/2017   Hyponatremia 11/04/2015   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/04/2015   Vomiting 11/04/2015   Altered mental status 11/04/2015   Absolute anemia    Other specified mental disorders due to known physiological condition 06/17/2015   Brain mass 12/25/2012   Obstructive hydrocephalus (HCC) 12/25/2012   ADHD (attention deficit hyperactivity disorder) 11/17/2012   Insomnia 11/17/2012   Loss of weight 11/17/2012   Anxiety state 11/17/2012   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     Past Medical History:  Diagnosis Date   Adrenal insufficiency    CAP (  community acquired pneumonia) 07/19/2023   Complication of anesthesia    slow to awaken   Craniopharyngioma [facility])    surgery June 2014   Headache(784.0)     Panhypopituitarism (diabetes insipidus/anterior pituitary deficiency)    Seizure (HCC) 07/20/2023   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   Vision abnormalities     Past medical history comments: See HPI  Surgical history: Past Surgical History:  Procedure Laterality Date   BRAIN SURGERY     June 2014   CIRCUMCISION     GASTROSTOMY TUBE CHANGE     removed   RADIOLOGY WITH ANESTHESIA N/A 08/13/2020   Procedure: MRI WITH ANESTHESIA OF BRAIN WITH AND WITHOUT CONTRAST;  Surgeon: Radiologist, Medication, MD;  Location: [facility] OR;  Service: Radiology;  Laterality: N/A;   RADIOLOGY WITH ANESTHESIA N/A 07/21/2022   Procedure: MRI BRAIN WITH AND WITHOUT CONTRAST WITH ANESTHESIA;  Surgeon: Radiologist, Medication, MD;  Location: [facility] OR;  Service: Radiology;  Laterality: N/A;   VENTRICULOPERITONEAL SHUNT       Family history: family history is not on file. He was adopted.   Social history: Social History   Socioeconomic History   Marital status: Single    Spouse name: Not on file   Number of children: Not on file   Years of education: Not on file   Highest education level: Not on file  Occupational History   Not on file  Tobacco Use   Smoking status: Never   Smokeless tobacco: Never  Vaping Use   Vaping status: Never Used  Substance and Sexual Activity   Alcohol use: No   Drug use: No   Sexual activity: Never  Other Topics Concern   Not on file  Social History Narrative   Pt lives at home with dad and  sister in college 1  His mother is deceased from malignant melanoma on 09/20/15. One dog in the house, no smoking.       grduated   Social Drivers of Health   Financial Resource Strain: Not on file  Food Insecurity: No Food Insecurity (07/20/2023)   Hunger Vital Sign    Worried About Running Out of Food in the Last Year: Never true    Ran Out of Food in the Last Year: Never true  Transportation Needs: No Transportation Needs (07/20/2023)   PRAPARE - Scientist, Research (physical Sciences) (Medical): No    Lack of Transportation (Non-Medical): No  Physical Activity: Not on file  Stress: Not on file  Social Connections: Not on file  Intimate Partner Violence: Patient Declined (07/20/2023)   Humiliation, Afraid, Rape, and Kick questionnaire    Fear of Current or Ex-Partner: Patient declined    Emotionally Abused: Patient declined    Physically Abused: Patient declined    Sexually Abused: Patient declined    Past/failed meds: Copied from previous record: Kapvay  - sleepiness   Allergies: No Known Allergies    Immunizations: Immunization History  Administered Date(s) Administered   Influenza,inj,Quad PF,6+ Mos 04/28/2017, 04/14/2018, 03/26/2020   PFIZER(Purple Top)SARS-COV-2 Vaccination 12/08/2019, 12/29/2019    Diagnostics/Screenings: Copied from previous record: 07/21/2022 - MRI brain w/o contrast - 1. Overall, no significant interval change since the prior brain MRI from 08/13/2020. No new or acute intracranial pathology. 2. No evidence of recurrent craniopharyngioma. 3. Stable position of the right parietal ventricular catheter with unchanged slit-like ventricles. 4. Unchanged diffuse pachymeningeal thickening and enhancement with a probable 7 mm thick left cerebral convexity subdural collection, and mild rightward midline shift.  06/16/2022 - Pediatric Bone Density - The patient's Z score is below expected range for age, consistent with low bone mineral density.   08/13/2020 - MRI Brain w/wo contrast - No definite residual or recurrent tumor. Shunt catheter present with slit-like caliber of the ventricles. Probably chronic small subdural fluid collection along left cerebral convexity. Minor rightward midline shift. Additional chronic findings detailed above.   08/27/2017 - CT head non contrast - No acute abnormality and no change from the prior study. Moderate to marked enlargement of the third ventricle and frontal horns bilaterally stable from the  prior study.   Physical Exam: BP 104/70 (BP Location: Left Arm, Patient Position: Sitting, Cuff Size: Small)   Pulse 60   Wt 172 lb (78 kg) Comment: Subtrated 5 lbs from 177 due to patinet having medical braces  on and pens in his pocket.  BMI 28.14 kg/m   General: Well-developed well-nourished adolescent boy in no acute distress Head: Macrocephalic. Has prominent jaw Ears, Nose and Throat: No signs of infection in conjunctivae, tympanic membranes, nasal passages, or oropharynx. Neck: Supple neck with full range of motion.  Respiratory: Lungs clear to auscultation Cardiovascular: Regular rate and rhythm, no murmurs, gallops or rubs; pulses normal in the upper and lower extremities. Musculoskeletal: No skeletal deformities. Wears knee brace on the let Skin: No lesions Trunk: Soft, non tender, normal bowel sounds, no hepatosplenomegaly.  Neurologic Exam Mental Status: Awake, alert, social. Became tearful when his behavior was discussed but was easily consoled. Attention span and fund of knowledge subnormal for age. Needs frequent redirection. Speech with significant dysarthria.  Cranial Nerves: Pupils equal, round and reactive to light.  Fundoscopic examination shows positive red reflex bilaterally.  Turns to localize visual and auditory stimuli in the periphery.  Symmetric facial strength.  Midline tongue and uvula. Motor: Mild low tone Sensory: Withdrawal in all extremities to noxious stimuli. Coordination: No tremor, dystaxia on reaching for objects. Gait: Able to stand and walk with walker or person assistance for support  Impression: Attention deficit hyperactivity disorder (ADHD), unspecified ADHD type - Plan: viloxazine ER (QELBREE) 200 MG 24 hr capsule  Insomnia, unspecified type - Plan: QUEtiapine  (SEROQUEL ) 50 MG tablet  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[facility]) - Plan: MR Brain W Wo Contrast  Chronic pain of left knee - Plan: Ambulatory Referral for DME  Nocturnal hypoxia    Recommendations for plan of care: The patient's previous Epic records were reviewed. No recent diagnostic studies to be reviewed with the patient. I talked with Dad about Xiomar's behavior and recommended that we increase the Seroquel  at bedtime to see if better sleep helps to improve his daytime behavior.   The last MRI was performed in January 2025. I will order a repeat MRI for January 2026  Recommendations and plan until next visit: Surveillance MRI Brain ordered Increase Seroquel  to 1+1/2 tablets at bedtime Continue other medications as prescribed  Referral placed for knee brace Call for questions or concerns Return in February   The medication list was reviewed and reconciled. I reviewed the changes that were made in the prescribed medications today. A complete medication list was provided to the patient.  Orders Placed This Encounter  Procedures   MR Brain W Wo Contrast    Standing Status:   Future    Expected Date:   07/10/2024    Expiration Date:   06/11/2025    If indicated for the ordered procedure, I authorize the administration of contrast media per Radiology protocol:   Yes  What is the patient's sedation requirement?:   General Anesthesia (available ONLY at [facility])    Does the patient have a pacemaker or implanted devices?:   No    Preferred imaging location?:   [facility] (table limit - 500lbs)   Ambulatory Referral for DME    Referral Priority:   Routine    Referral Type:   Durable Medical Equipment Purchase    Number of Visits Requested:   1     Allergies as of 05/24/2024   No Known Allergies      Medication List        Accurate as of May 24, 2024 11:59 PM. If you have any questions, ask your nurse or doctor.          B-D 3CC LUER-LOK SYR 25GX1 25G X 1 3 ML Misc Generic drug: SYRINGE-NEEDLE (DISP) 3 ML Use with hydrocortisone  for adrenal crisis.   B-D UF III MINI PEN NEEDLES 31G X 5 MM Misc Generic drug: Insulin  Pen  Needle Use as directed with growth hormone.   desmopressin  0.1 MG tablet Commonly known as: DDAVP  TAKE 2 TABLETS (0.2 MG TOTAL) BY MOUTH TWICE A DAY   folic acid  1 MG tablet Commonly known as: FOLVITE  TAKE 1 TABLET BY MOUTH EVERY DAY   guanFACINE  1 MG Tb24 ER tablet Commonly known as: INTUNIV  TAKE 1 TABLET BY MOUTH EVERY DAY   hydrocortisone  10 MG tablet Commonly known as: CORTEF  TAKE 1 TAB BY MOUTH IN MORNING,TAKE 1/2 TAB BY MOUTH AT LUNCH&DINNER. Stress dose: triple or double the dose.   Insulin  Syringe-Needle U-100 31G X 15/64 0.3 ML Misc Use daily.   Lacosamide  100 MG Tabs Take 1 tablet (100 mg total) by mouth in the morning and at bedtime.   levothyroxine  88 MCG tablet Commonly known as: SYNTHROID  TAKE 1 TABLET BY MOUTH EVERY DAY BEFORE BREAKFAST   magnesium  aspartate 615 MG tablet Commonly known as: MAGINEX Take 615 mg by mouth daily.   magnesium  hydroxide 400 MG/5ML suspension Commonly known as: MILK OF MAGNESIA Take 20 mLs by mouth 2 (two) times daily.   melatonin 5 MG Tabs Take 5 mg by mouth at bedtime.   Norditropin  FlexPro 30 MG/3ML Sopn Generic drug: Somatropin  Inject 1.6 mg into the skin at bedtime.   potassium chloride  10 MEQ tablet Commonly known as: KLOR-CON  TAKE 1 TABLET ( ) BY MOUTH DAILY**ON WEDNESDAY AND SUNDAY TAKE TWICE A DAY**   QUEtiapine  50 MG tablet Commonly known as: SEROQUEL  Take 1+1/2 tablets at nighttime for sleep.  May give 1/4-1/2 PRN daily for aggression. What changed: additional instructions Changed by: Ellouise Bollman   sodium chloride  (PF) 0.9 % injection For use in adrenal crisis to be mixed with hydrocortisone  as directed.   Solu-CORTEF  100 MG injection Generic drug: hydrocortisone  sodium succinate  Inject 100 mg into the muscle for 1 dose for acute adrenal insufficiency, then call EMS/go to the emergency room.   testosterone  enanthate 200 MG/ML injection Commonly known as: DELATESTRYL  Inject 6 mg into the skin  once a week. 6 mg = 0.03 mL = 3 units on insulin  syringe   Valtoco  15 MG Dose 7.5 MG/0.1ML Lqpk Generic drug: diazePAM  (15 MG Dose) Place 15 mg into the nose as needed (Inhale one spray ' 7.5 mg' in each nostril as needed for break through seizures).   Ventolin  HFA 108 (90 Base) MCG/ACT inhaler Generic drug: albuterol  Inhale 1 puff into the lungs every 6 (six) hours as needed for wheezing or shortness of  breath.   albuterol  (2.5 MG/3ML) 0.083% nebulizer solution Commonly known as: PROVENTIL  Take 3 mLs (2.5 mg total) by nebulization every 6 (six) hours as needed for wheezing (cough).   viloxazine ER 200 MG 24 hr capsule Commonly known as: QELBREE Take 1 capsule (200 mg total) by mouth daily.   vitamin B-12 100 MCG tablet Commonly known as: CYANOCOBALAMIN  Take 1 tablet (100 mcg total) by mouth daily.   Vitamin D  125 MCG (4999 UT) Caps Take 5,000 Units by mouth every morning.      I spent 35 minutes caring for the patient today face to face reviewing records, including previous charts and test results, examination of the patient, discussion and education with Dad about his condition, documentation in his chart, developing a plan of care and ordering/placing referrals.  Ellouise Bollman NP-C [facility] [address] [place], [place] [address] Ph. [phone] Fax [phone]

## 2024-05-25 NOTE — Telephone Encounter (Signed)
 Form printed from [employer] and placed on providers desk.   SS, CCMA

## 2024-05-25 NOTE — Telephone Encounter (Signed)
 Forms signed and faxed to [facility] of Care.

## 2024-05-25 NOTE — Telephone Encounter (Signed)
  Name of who is calling: Joesph Millard Relationship to Patient: Rep from [facility] Care  Best contact number: [phone]  Provider they see: [facility]  Reason for call: Rep called because she needs the plan of care and medicaid paperwork sent in today or the patient will lose his services. It looks like there is a provider mix up. Ellouise are you able to sign off on the paperwork?      PRESCRIPTION REFILL ONLY  Name of prescription:  Pharmacy:

## 2024-05-27 ENCOUNTER — Encounter (INDEPENDENT_AMBULATORY_CARE_PROVIDER_SITE_OTHER): Payer: Self-pay | Admitting: Family

## 2024-05-29 ENCOUNTER — Encounter (INDEPENDENT_AMBULATORY_CARE_PROVIDER_SITE_OTHER): Payer: Self-pay | Admitting: Pediatrics

## 2024-06-02 ENCOUNTER — Other Ambulatory Visit (INDEPENDENT_AMBULATORY_CARE_PROVIDER_SITE_OTHER): Payer: Self-pay | Admitting: Family

## 2024-06-02 DIAGNOSIS — G4700 Insomnia, unspecified: Secondary | ICD-10-CM

## 2024-06-06 ENCOUNTER — Ambulatory Visit: Admitting: Podiatry

## 2024-06-06 ENCOUNTER — Encounter: Payer: Self-pay | Admitting: Podiatry

## 2024-06-06 ENCOUNTER — Encounter (INDEPENDENT_AMBULATORY_CARE_PROVIDER_SITE_OTHER): Payer: Self-pay

## 2024-06-06 DIAGNOSIS — M79672 Pain in left foot: Secondary | ICD-10-CM | POA: Diagnosis not present

## 2024-06-06 DIAGNOSIS — B351 Tinea unguium: Secondary | ICD-10-CM

## 2024-06-06 DIAGNOSIS — M79671 Pain in right foot: Secondary | ICD-10-CM | POA: Diagnosis not present

## 2024-06-06 NOTE — Progress Notes (Signed)
 Patient presents for evaluation and treatment of tenderness and some redness around nails feet.  Tenderness around toes with walking and wearing shoes.  Mentally challenged.  Dents with caregivers.  Physical exam:  General appearance: Alert, pleasant, and in no acute distress.  Vascular: Pedal pulses: DP 2/4 B/L, PT 2/4 B/L. MIld edema lower legs bilaterally.  Capillary refill time immediate bilaterally  Neurologic:  Dermatologic:  Nails thickened, disfigured, discolored 1-5 BL with subungual debris.  Redness and hypertrophic nail folds along nail folds bilaterally but no signs of drainage or infection.  Musculoskeletal:     Diagnosis: 1. Painful onychomycotic nails 1 through 5 bilaterally. 2. Pain toes 1 through 5 bilaterally.  Plan: -Debrided onychomycotic nails 1 through 5 bilaterally.  Sharply debrided nails with nail clipper and reduced with a power bur.  Return 3 months RFC

## 2024-06-13 ENCOUNTER — Telehealth (INDEPENDENT_AMBULATORY_CARE_PROVIDER_SITE_OTHER): Payer: Self-pay

## 2024-06-13 NOTE — Telephone Encounter (Signed)
 Front desk transferred a call from [facility] pharmacy to me.   The representative wanted to discuss the medication Norditropin . I informed her that this call would need to be transferred to Endocrinology.   The representative did not like the fact that she had to be transferred again but was willing be transferred. She asked for the correct number to call so that she doesn't run into this issue again.  I informed her that she called the correct number, and explained that the medication she was calling in reference to is being managed by another specialist within the practice.   The representative and I waited approximately 5 minutes before the RN asked for a note to be placed to a callback at a later time.   I relayed this message to the representative who verbalized understanding.   She provided the number: [phone] as a good call back.   SS, CCMA

## 2024-06-13 NOTE — Telephone Encounter (Signed)
 Returned call, they wanted to confirm the needle, the paperwork sent in said 4cc.   She stated that he has been receiving the 31 by 5 mm pen needle.  I stated that she prefers the 4mm needle.  She again stated that patient has been getting the 5 mm needle.  I confirmed that it is ok to keep what he has been getting.

## 2024-06-14 ENCOUNTER — Ambulatory Visit: Admitting: Student

## 2024-06-14 VITALS — BP 105/61 | HR 68 | Wt 174.7 lb

## 2024-06-14 DIAGNOSIS — Z23 Encounter for immunization: Secondary | ICD-10-CM | POA: Diagnosis not present

## 2024-06-14 DIAGNOSIS — R569 Unspecified convulsions: Secondary | ICD-10-CM

## 2024-06-14 DIAGNOSIS — G4700 Insomnia, unspecified: Secondary | ICD-10-CM

## 2024-06-14 DIAGNOSIS — E230 Hypopituitarism: Secondary | ICD-10-CM | POA: Diagnosis present

## 2024-06-14 DIAGNOSIS — E876 Hypokalemia: Secondary | ICD-10-CM | POA: Diagnosis not present

## 2024-06-14 DIAGNOSIS — G4701 Insomnia due to medical condition: Secondary | ICD-10-CM

## 2024-06-14 DIAGNOSIS — Z79899 Other long term (current) drug therapy: Secondary | ICD-10-CM

## 2024-06-14 DIAGNOSIS — E870 Hyperosmolality and hypernatremia: Secondary | ICD-10-CM

## 2024-06-14 NOTE — Assessment & Plan Note (Addendum)
 No seizures since January. Continue Vimpat 

## 2024-06-14 NOTE — Progress Notes (Signed)
 CC: Establish Care  HPI:  Mr.Ronald Brown is a 18 y.o. male living with a history stated below and presents today for follow-up. Please see problem based assessment and plan for additional details.  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 75 mL 0   albuterol  (VENTOLIN  HFA) 108 (90 Base) MCG/ACT inhaler Inhale 1 puff into the lungs every 6 (six) hours as needed for wheezing or shortness of breath.     Cholecalciferol  (VITAMIN D ) 125 MCG (5000 UT) CAPS Take 5,000 Units by mouth every morning.     desmopressin  (DDAVP ) 0.1 MG tablet TAKE 2 TABLETS (0.2 MG TOTAL) BY MOUTH TWICE A DAY 360 tablet 1   diazePAM , 15 MG Dose, (VALTOCO  15 MG DOSE) 2 x 7.5 MG/0.1ML LQPK Place 15 mg into the nose as needed (Inhale one spray ' 7.5 mg' in each nostril as needed for break through seizures). 2 each 0   folic acid  (FOLVITE ) 1 MG tablet TAKE 1 TABLET BY MOUTH EVERY DAY 90 tablet 1   guanFACINE  (INTUNIV ) 1 MG TB24 ER tablet TAKE 1 TABLET BY MOUTH EVERY DAY 90 tablet 1   hydrocortisone  (CORTEF ) 10 MG tablet TAKE 1 TAB BY MOUTH IN MORNING,TAKE 1/2 TAB BY MOUTH AT LUNCH&DINNER. Stress dose: triple or double the dose. 270 tablet 1   hydrocortisone  sodium succinate  (SOLU-CORTEF ) 100 MG injection Inject 100 mg into the muscle for 1 dose for acute adrenal insufficiency, then call EMS/go to the emergency room. 2 mL 5   Insulin   Pen Needle (B-D UF III MINI PEN NEEDLES) 31G X 5 MM MISC Use as directed with growth hormone. 100 each 3   Insulin  Syringe-Needle U-100 31G X 15/64 0.3 ML MISC Use daily. 30 each 6   Lacosamide  100 MG TABS Take 1 tablet (100 mg total) by mouth in the morning and at bedtime. 180 tablet 2   levothyroxine  (SYNTHROID ) 88 MCG tablet TAKE 1 TABLET BY MOUTH EVERY DAY BEFORE BREAKFAST 90 tablet 1   magnesium  aspartate (MAGINEX) 615 MG tablet Take 615 mg by mouth daily.     magnesium  hydroxide (MILK OF MAGNESIA) 400 MG/5ML suspension Take 20 mLs by mouth 2 (two) times daily.     melatonin 5 MG TABS Take 5 mg by mouth at bedtime.     potassium chloride  (KLOR-CON ) 10 MEQ tablet TAKE 1 TABLET ( ) BY MOUTH DAILY**ON WEDNESDAY AND SUNDAY TAKE TWICE A DAY** 108 tablet 5   QUEtiapine  (SEROQUEL ) 50 MG tablet Take 1+1/2 tablets at nighttime for sleep.  May give 1/4-1/2 PRN daily for aggression. 180 tablet 1   sodium chloride , PF, 0.9 % injection For use in adrenal crisis to be mixed with hydrocortisone  as directed. 20 mL 5   Somatropin  (NORDITROPIN  FLEXPRO) 30 MG/3ML SOPN Inject 1.6 mg into the skin at bedtime. 6 mL 5   Syringe/Needle, Disp, (SYRINGE 3CC/25GX1) 25G X 1 3 ML MISC Use with  hydrocortisone  for adrenal crisis. 100 each 1   testosterone  enanthate (DELATESTRYL ) 200 MG/ML injection Inject 6 mg into the skin once a week. 6 mg = 0.03 mL = 3 units on insulin  syringe 5 mL 5   viloxazine ER (QELBREE) 200 MG 24 hr capsule Take 1 capsule (200 mg total) by mouth daily. 90 capsule 2   vitamin B-12 (CYANOCOBALAMIN ) 100 MCG tablet Take 1 tablet (100 mcg total) by mouth daily. 100 tablet 3   No current facility-administered medications on file prior to visit.    Family History  Adopted: Yes    Social History   Socioeconomic History   Marital status: Single    Spouse name: Not on file   Number of children: Not on file   Years of education: Not on file   Highest education level: Not on file   Occupational History   Not on file  Tobacco Use   Smoking status: Never   Smokeless tobacco: Never  Vaping Use   Vaping status: Never Used  Substance and Sexual Activity   Alcohol use: No   Drug use: No   Sexual activity: Never  Other Topics Concern   Not on file  Social History Narrative   Pt lives at home with dad and  sister in college 1  His mother is deceased from malignant melanoma on 09/20/15. One dog in the house, no smoking.       grduated   Social Drivers of Health   Tobacco Use: Low Risk (06/06/2024)   Patient History    Smoking Tobacco Use: Never    Smokeless Tobacco Use: Never    Passive Exposure: Not on file  Financial Resource Strain: Not on file  Food Insecurity: No Food Insecurity (07/20/2023)   Hunger Vital Sign    Worried About Running Out of Food in the Last Year: Never true    Ran Out of Food in the Last Year: Never true  Transportation Needs: No Transportation Needs (07/20/2023)   PRAPARE - Administrator, Civil Service (Medical): No    Lack of Transportation (Non-Medical): No  Physical Activity: Not on file  Stress: Not on file  Social Connections: Not on file  Intimate Partner Violence: Patient Declined (07/20/2023)   Humiliation, Afraid, Rape, and Kick questionnaire    Fear of Current or Ex-Partner: Patient declined    Emotionally Abused: Patient declined    Physically Abused: Patient declined    Sexually Abused: Patient declined  Depression (PHQ2-9): Not on file  Alcohol Screen: Not on file  Housing: Low Risk (07/20/2023)   Housing Stability Vital Sign    Unable to Pay for Housing in the Last Year: No    Number of Times Moved in the Last Year: 0    Homeless in the Last Year: No  Utilities: Not At Risk (07/20/2023)   [facility] Utilities    Threatened with loss of utilities: No  Health Literacy: Not on file    Review of Systems: ROS negative except for what is noted on the assessment and plan.  Vitals:   06/14/24 1519  BP: 105/61   Pulse: 68  TempSrc: Oral  SpO2: 100%  Weight: 174 lb 11.2 oz (79.2 kg)    Physical Exam: Constitutional: sitting in chair, in no acute distress Cardiovascular: regular rate and rhythm, no m/r/g Pulmonary/Chest: normal work of breathing on room air, lungs clear to auscultation bilaterally Abdominal: soft, non-tender, non-distended MSK: left knee brace Skin: warm and dry Psych: normal mood and  behavior  Assessment & Plan:    Patient discussed with Dr. 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. Follows with Endocrinology (last seen 8/28 with follow up scheduled for 08/31/2024. -Continue Testosterone , Hydrocortisone , DDAVP , Synthroid , Norditropin  (all managed by Endocrinology)  Insomnia Continue Seroquel    Seizure (HCC) No seizures since January. Continue Vimpat   Hypernatremia 152 as noted 9/18 with [facility] through Endocrinology. Plan was to address with hydration. Attempted repeat BMP today but patient was dehydrated which made draw difficult and sample hemolyzed. Will call patient to schedule lab only visit.   Hypokalemia 3.4 as noted 9/18 with RFP through Endocrinology. See above.    Norman Lobstein, D.O. [facility] Internal Medicine, PGY-2 Date 06/15/2024 Time 1:44 PM

## 2024-06-14 NOTE — Assessment & Plan Note (Signed)
 Continue Seroquel.

## 2024-06-14 NOTE — Assessment & Plan Note (Addendum)
 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. Follows with Endocrinology (last seen 8/28 with follow up scheduled for 08/31/2024. -Continue Testosterone , Hydrocortisone , DDAVP , Synthroid , Norditropin  (all managed by Endocrinology)

## 2024-06-15 ENCOUNTER — Encounter: Payer: Self-pay | Admitting: Student

## 2024-06-15 DIAGNOSIS — E876 Hypokalemia: Secondary | ICD-10-CM | POA: Insufficient documentation

## 2024-06-15 NOTE — Assessment & Plan Note (Signed)
 3.4 as noted 9/18 with RFP through Endocrinology. See above.

## 2024-06-15 NOTE — Assessment & Plan Note (Signed)
 152 as noted 9/18 with RFP through Endocrinology. Plan was to address with hydration. Attempted repeat BMP today but patient was dehydrated which made draw difficult and sample hemolyzed. Will call patient to schedule lab only visit.

## 2024-06-20 ENCOUNTER — Encounter (INDEPENDENT_AMBULATORY_CARE_PROVIDER_SITE_OTHER): Payer: Self-pay

## 2024-06-20 ENCOUNTER — Other Ambulatory Visit (INDEPENDENT_AMBULATORY_CARE_PROVIDER_SITE_OTHER): Payer: Self-pay | Admitting: Pediatrics

## 2024-06-20 DIAGNOSIS — E230 Hypopituitarism: Secondary | ICD-10-CM

## 2024-06-20 DIAGNOSIS — E038 Other specified hypothyroidism: Secondary | ICD-10-CM

## 2024-06-21 ENCOUNTER — Encounter (INDEPENDENT_AMBULATORY_CARE_PROVIDER_SITE_OTHER): Payer: Self-pay

## 2024-06-21 NOTE — Telephone Encounter (Signed)
 Referral sent to [facility].   SS, CCMA

## 2024-06-22 NOTE — Progress Notes (Signed)
 Internal Medicine Clinic Attending  Case discussed with the resident at the time of the visit.  We reviewed the resident's history and exam and pertinent patient test results.  I agree with the assessment, diagnosis, and plan of care documented in the resident's note.

## 2024-07-10 NOTE — Telephone Encounter (Signed)
 [employer] form was  received and placed in the PCP's box to sign an complete.  Copied from CRM #[id number]. Topic: Clinical - Order For Equipment >> Jun 23, 2024  9:59 AM Cherylann RAMAN wrote: Reason for CRM: Ann with [employer] called to confirm if office received DME renewal that was faxed over on 12/16. Reached out to CAL and tx'd call to CAL successfully >> Jul 04, 2024  8:51 AM Graeme ORN wrote:  Ann from [place] (Other) [phone]  Called to check if fax received. Sent on 12/19. Not showing in Media. Will refax. Thank You

## 2024-07-10 NOTE — Telephone Encounter (Signed)
 Per note from Fredrica a Form would not be scanned into Media unless it has been rec'd and sign by a PCP.  Unclear why the CRM message states:-  A PCp would not know to sign a form if it has already been scanned into the chart.  : Jenkins from [place] (Other) [phone]  Called to check if fax received. Sent on 12/19. Not showing in Media. Will refax. Thank You

## 2024-07-17 NOTE — Telephone Encounter (Signed)
 Order with the notes have been fax back today to [place].

## 2024-07-20 ENCOUNTER — Other Ambulatory Visit (INDEPENDENT_AMBULATORY_CARE_PROVIDER_SITE_OTHER): Payer: Self-pay | Admitting: Pediatrics

## 2024-07-20 ENCOUNTER — Telehealth (INDEPENDENT_AMBULATORY_CARE_PROVIDER_SITE_OTHER): Payer: Self-pay

## 2024-07-20 DIAGNOSIS — E232 Diabetes insipidus: Secondary | ICD-10-CM

## 2024-07-20 NOTE — Telephone Encounter (Signed)
 Lab tech called about a BMP, reached out to Dr. Margarete and she ordered it.  Lab tech updated.

## 2024-07-21 ENCOUNTER — Ambulatory Visit (INDEPENDENT_AMBULATORY_CARE_PROVIDER_SITE_OTHER): Payer: Self-pay | Admitting: Pediatrics

## 2024-07-21 LAB — BASIC METABOLIC PANEL WITH GFR
BUN: 18 mg/dL (ref 7–20)
CO2: 26 mmol/L (ref 20–32)
Calcium: 9.9 mg/dL (ref 8.9–10.4)
Chloride: 114 mmol/L — ABNORMAL HIGH (ref 98–110)
Creat: 1.03 mg/dL (ref 0.60–1.24)
Glucose, Bld: 125 mg/dL (ref 65–139)
Potassium: 3.4 mmol/L — ABNORMAL LOW (ref 3.8–5.1)
Sodium: 149 mmol/L — ABNORMAL HIGH (ref 135–146)
eGFR: 107 mL/min/1.73m2

## 2024-07-21 NOTE — Progress Notes (Signed)
Stable electrolytes

## 2024-08-03 ENCOUNTER — Encounter: Payer: Self-pay | Admitting: Student

## 2024-08-24 ENCOUNTER — Ambulatory Visit (INDEPENDENT_AMBULATORY_CARE_PROVIDER_SITE_OTHER): Admitting: Pediatrics

## 2024-08-31 ENCOUNTER — Ambulatory Visit (INDEPENDENT_AMBULATORY_CARE_PROVIDER_SITE_OTHER): Admitting: Pediatrics

## 2024-09-06 ENCOUNTER — Ambulatory Visit: Admitting: Podiatry

## 2024-10-02 ENCOUNTER — Ambulatory Visit: Admitting: "Endocrinology

## 2024-10-26 ENCOUNTER — Other Ambulatory Visit (HOSPITAL_COMMUNITY)

## 2024-10-26 ENCOUNTER — Ambulatory Visit (HOSPITAL_COMMUNITY): Admit: 2024-10-26

## 2024-10-26 SURGERY — MRI WITH ANESTHESIA
Anesthesia: General
